# Patient Record
Sex: Male | Born: 1985 | Race: White | Hispanic: No | Marital: Single
Health system: Southern US, Community
[De-identification: ages and names within clinical notes are randomized; demographics above are authoritative.]

## PROBLEM LIST (undated history)

## (undated) DIAGNOSIS — L409 Psoriasis, unspecified: Secondary | ICD-10-CM

## (undated) DIAGNOSIS — J45909 Unspecified asthma, uncomplicated: Secondary | ICD-10-CM

---

## 2005-08-03 ENCOUNTER — Emergency Department (HOSPITAL_COMMUNITY): Admission: EM | Admit: 2005-08-03 | Discharge: 2005-08-03 | Payer: Self-pay | Admitting: Emergency Medicine

## 2007-06-20 IMAGING — US US SCROTUM
1 series · 14 of 25 positions shown · non-contrast
Comparison: None.

CLINICAL DATA: Left testicular pain and swelling.
SCROTAL ULTRASOUND:
DOPPLER ULTRASOUND OF THE TESTICLES:
TECHNIQUE: Complete ultrasound examination of the testicles, epididymis, and other scrotal structures was performed.  Color and spectral Doppler ultrasound were also utilized to evaluate blood flow to the testicles.

[Series 1: unknown · 0.09mm/px · 14 of 65 slices shown]
[im 1/65]
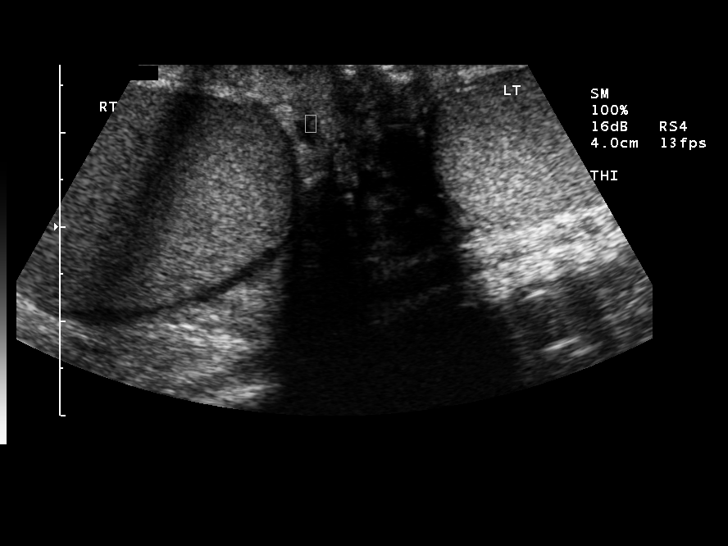
[im 6/65]
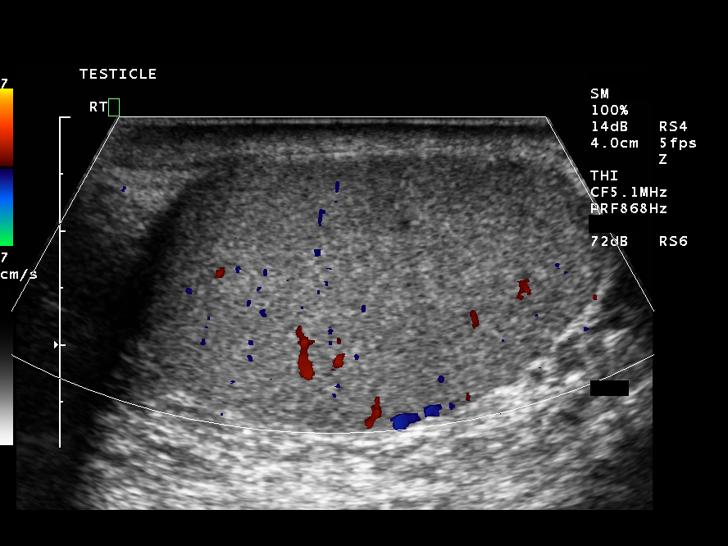
[im 11/65]
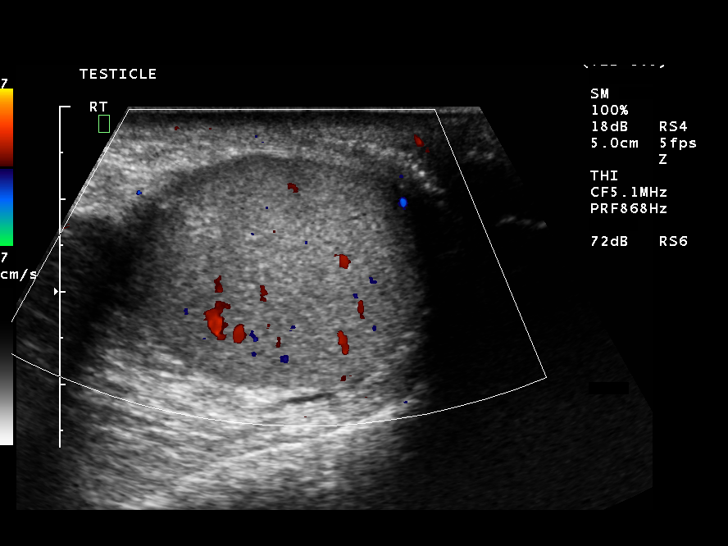
[im 17/65]
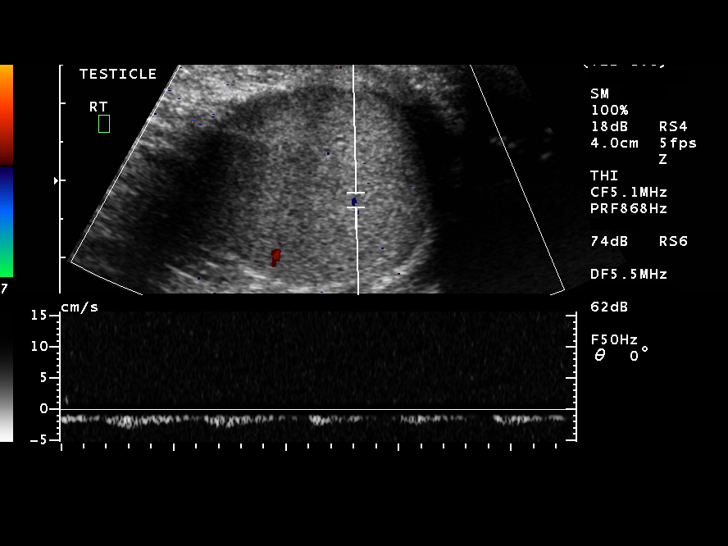
[im 22/65]
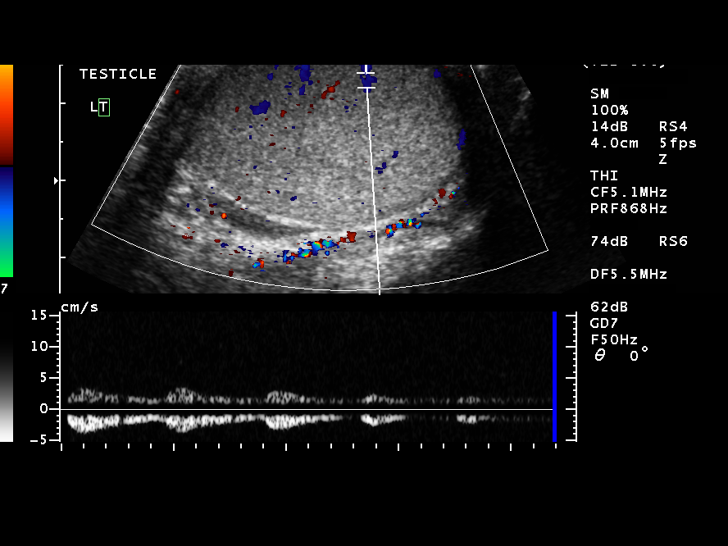
[im 25/65]
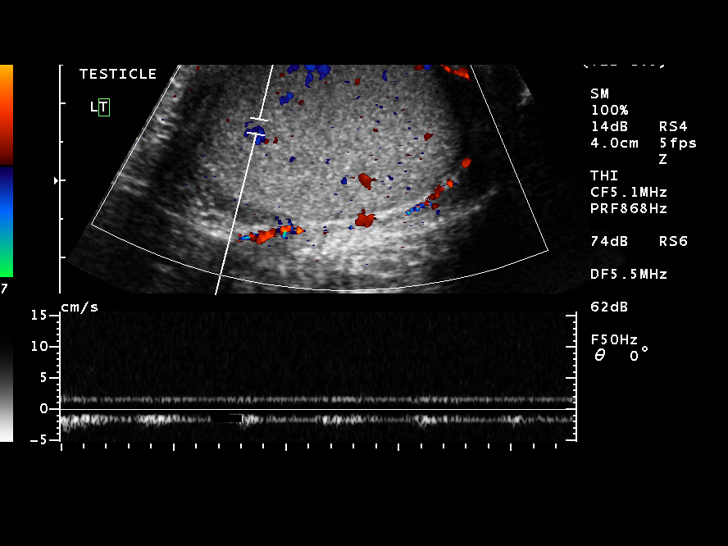
[im 30/65]
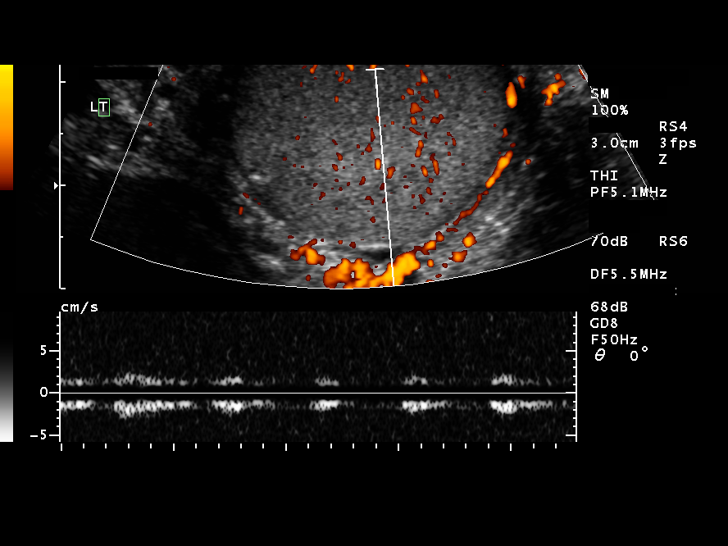
[im 35/65]
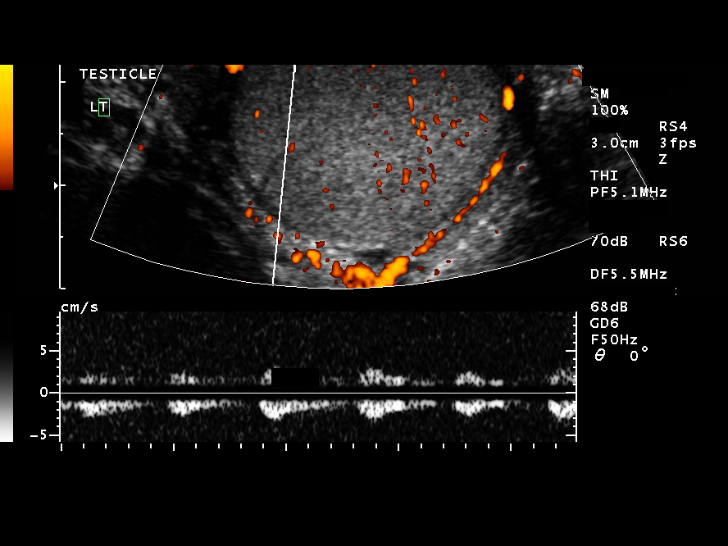
[im 41/65]
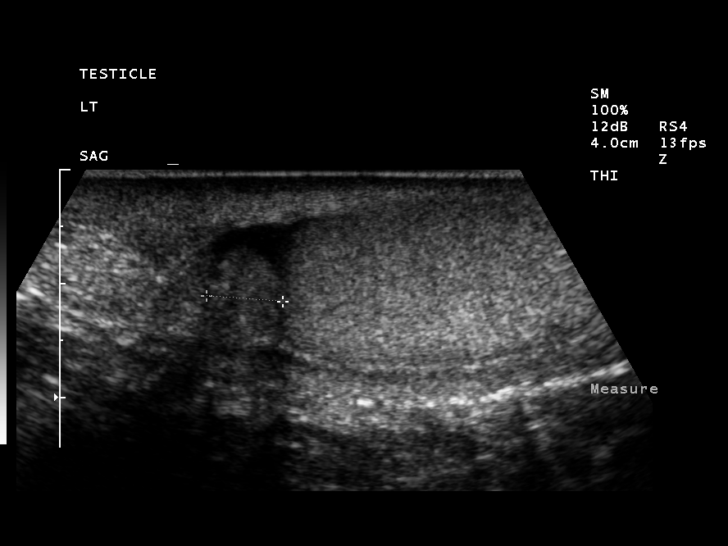
[im 43/65]
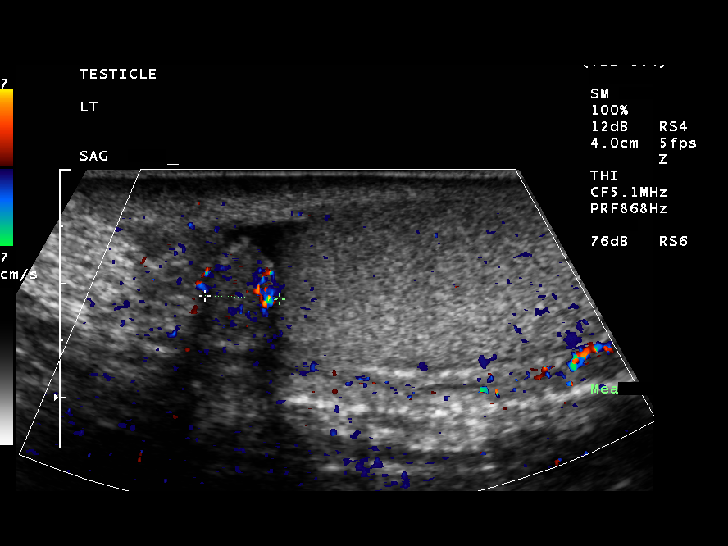
[im 49/65]
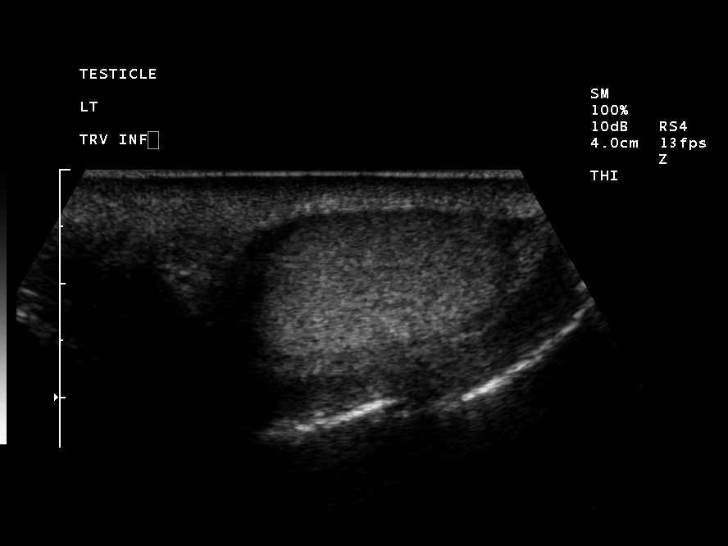
[im 54/65]
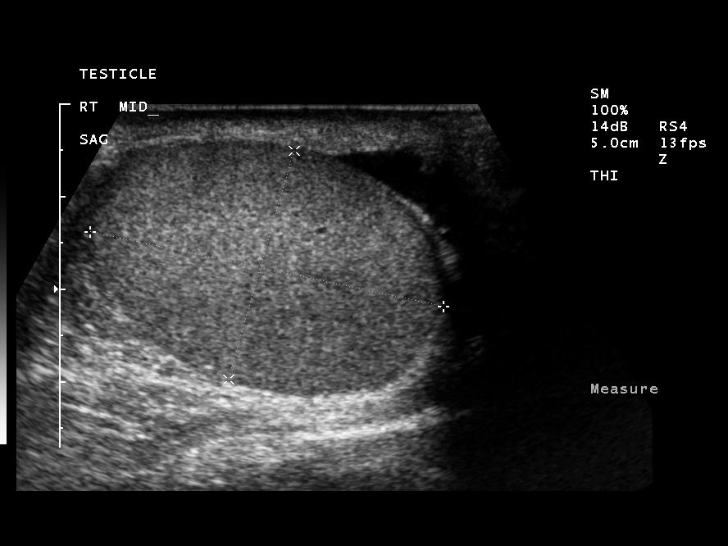
[im 59/65]
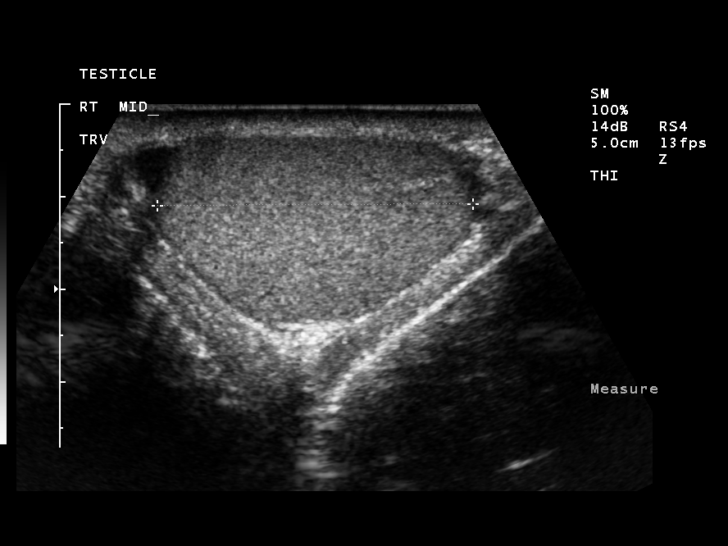
[im 65/65]
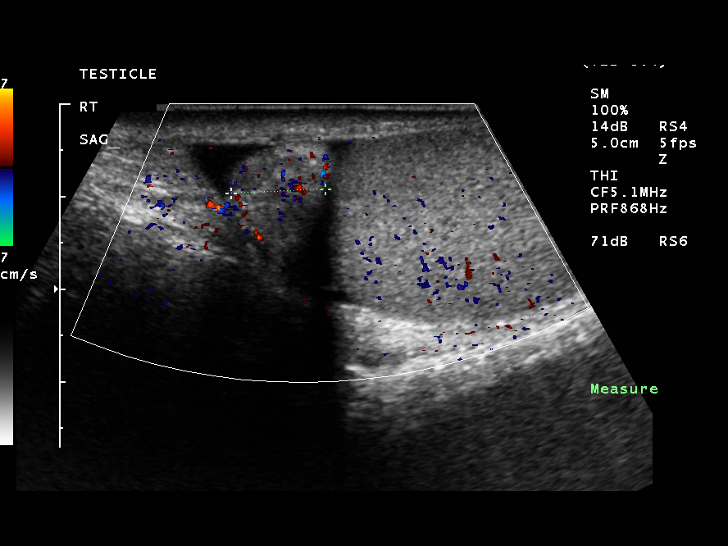

[14 of 25 positions shown; findings below may reference images not displayed]

FINDINGS: Right testis 3.9 x 2.6 x 3.4 cm.  Left testis 4.3 x 2.2 x 4.1 cm.  There are normal color and spectral tracings within bilateral testes.  There are small hydroceles bilaterally.  Both epididymides are normal in size and echo texture.  Apparent Doppler signal in the epididymides is felt to be due to the level of gain placed on the transducer rather than actual hyperemia.
IMPRESSION: 1.  Small bilateral hydroceles. 
2.  No evidence of testicular torsion or other explanation for left-sided testicular pain.

## 2014-04-28 ENCOUNTER — Ambulatory Visit: Payer: Self-pay | Admitting: Family Medicine

## 2016-03-14 IMAGING — CR DG FEMUR 2V*R*
4 series · 4 of 4 positions shown · non-contrast
Comparison: None.

CLINICAL DATA: Acute upper thigh burning, sudden onset and getting
worse.

EXAM:
RIGHT FEMUR - 2 VIEW

[femur ap (1 of 2)]
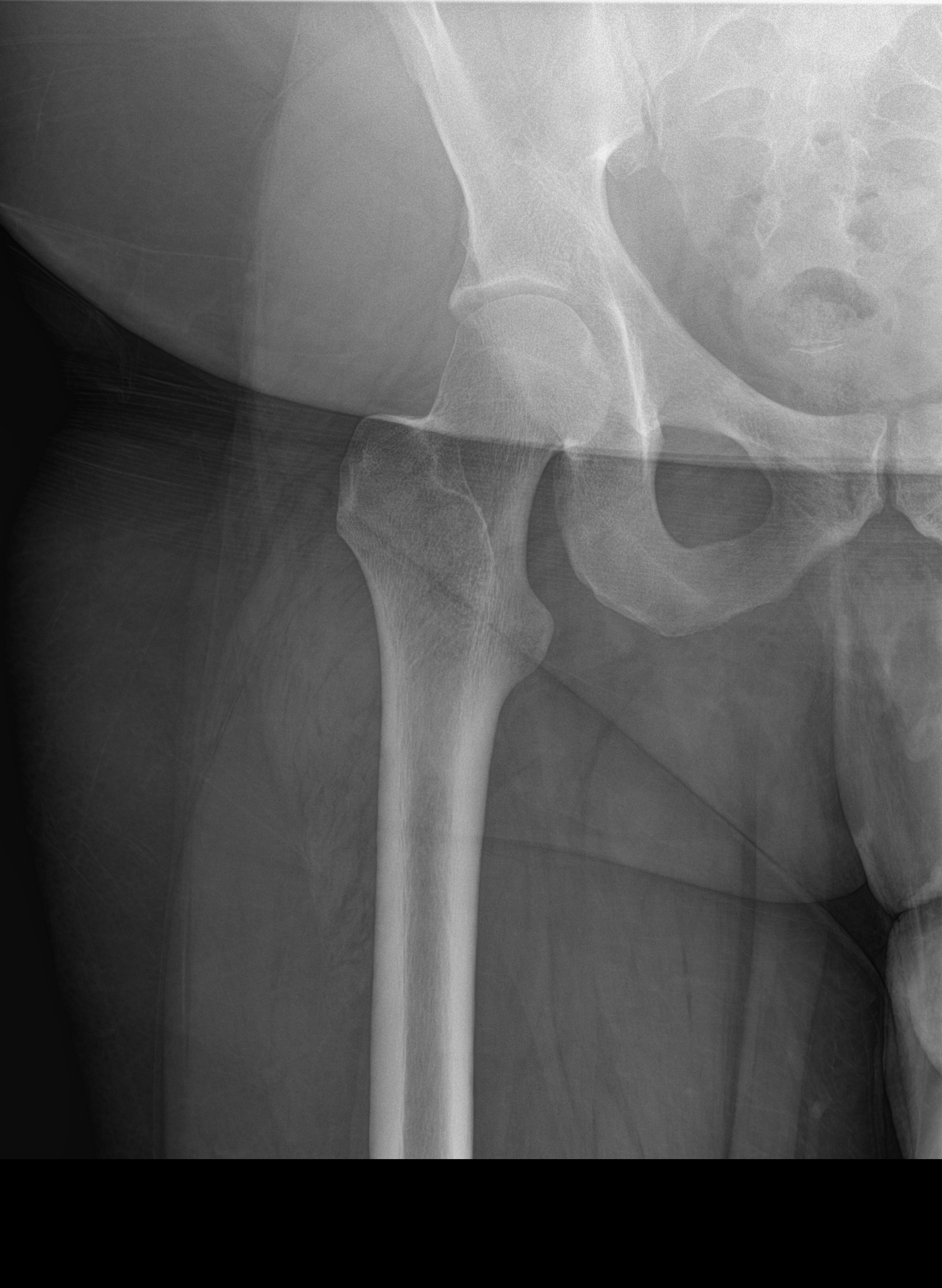

[femur ap (2 of 2)]
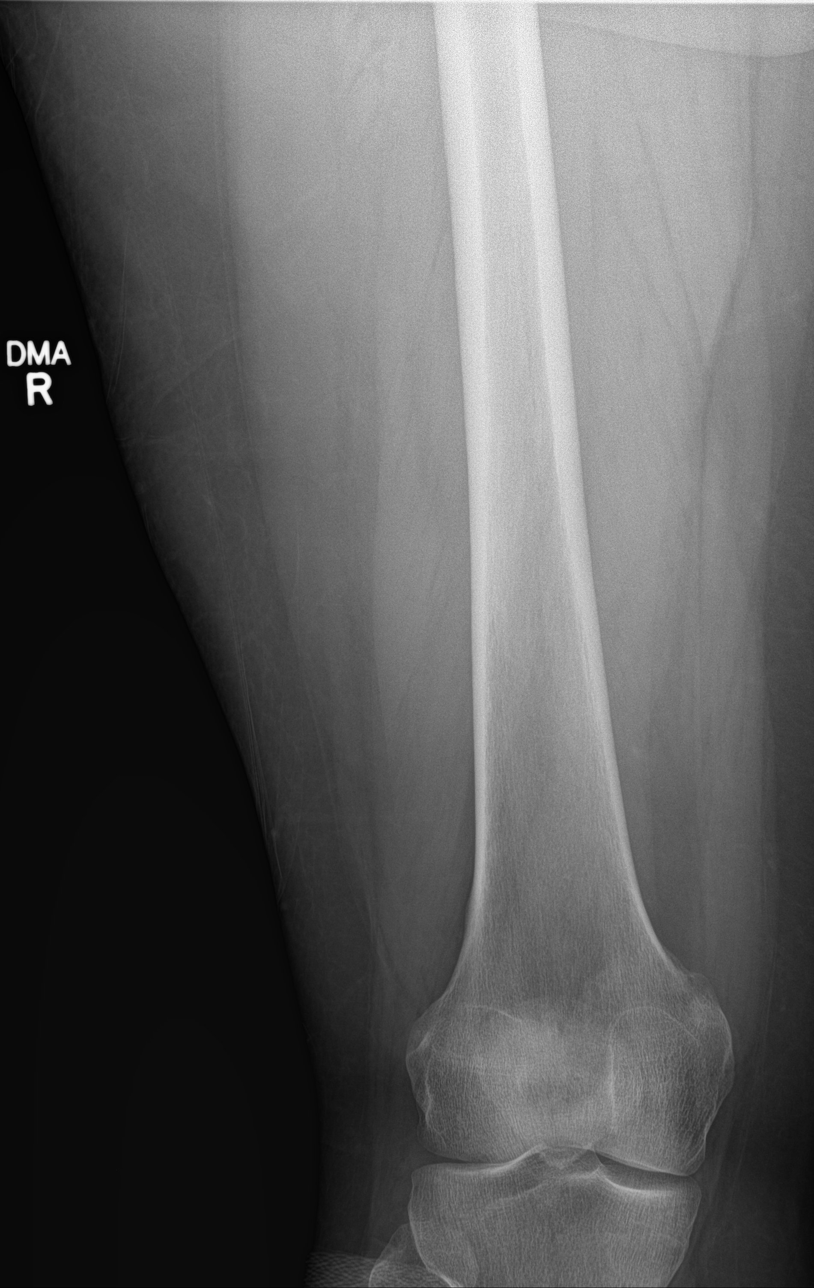

[femur lat (1 of 2)]
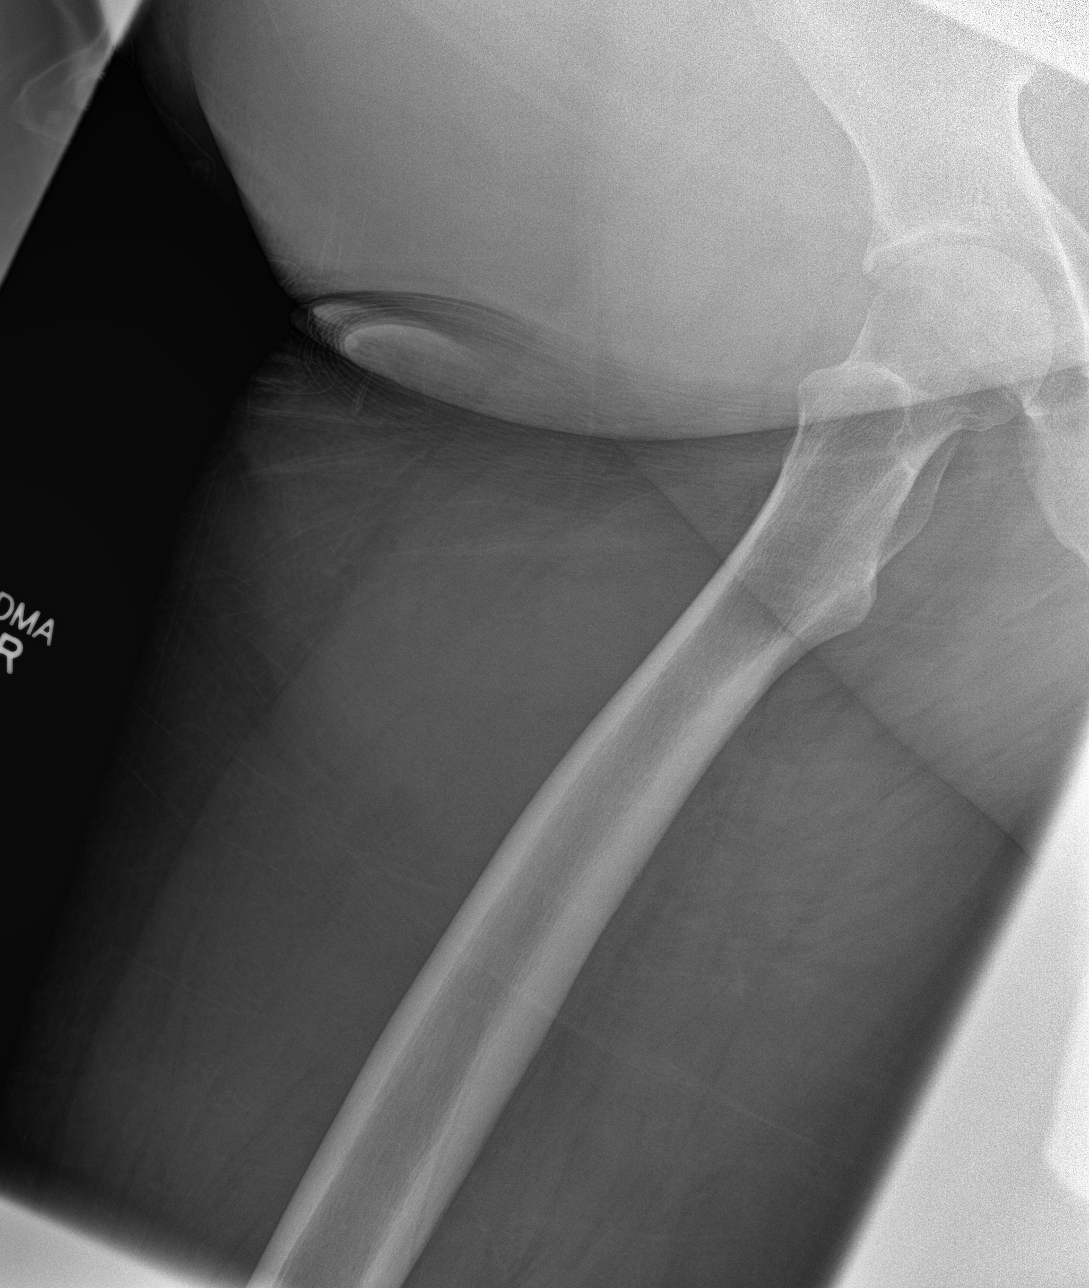

[femur lat (2 of 2)]
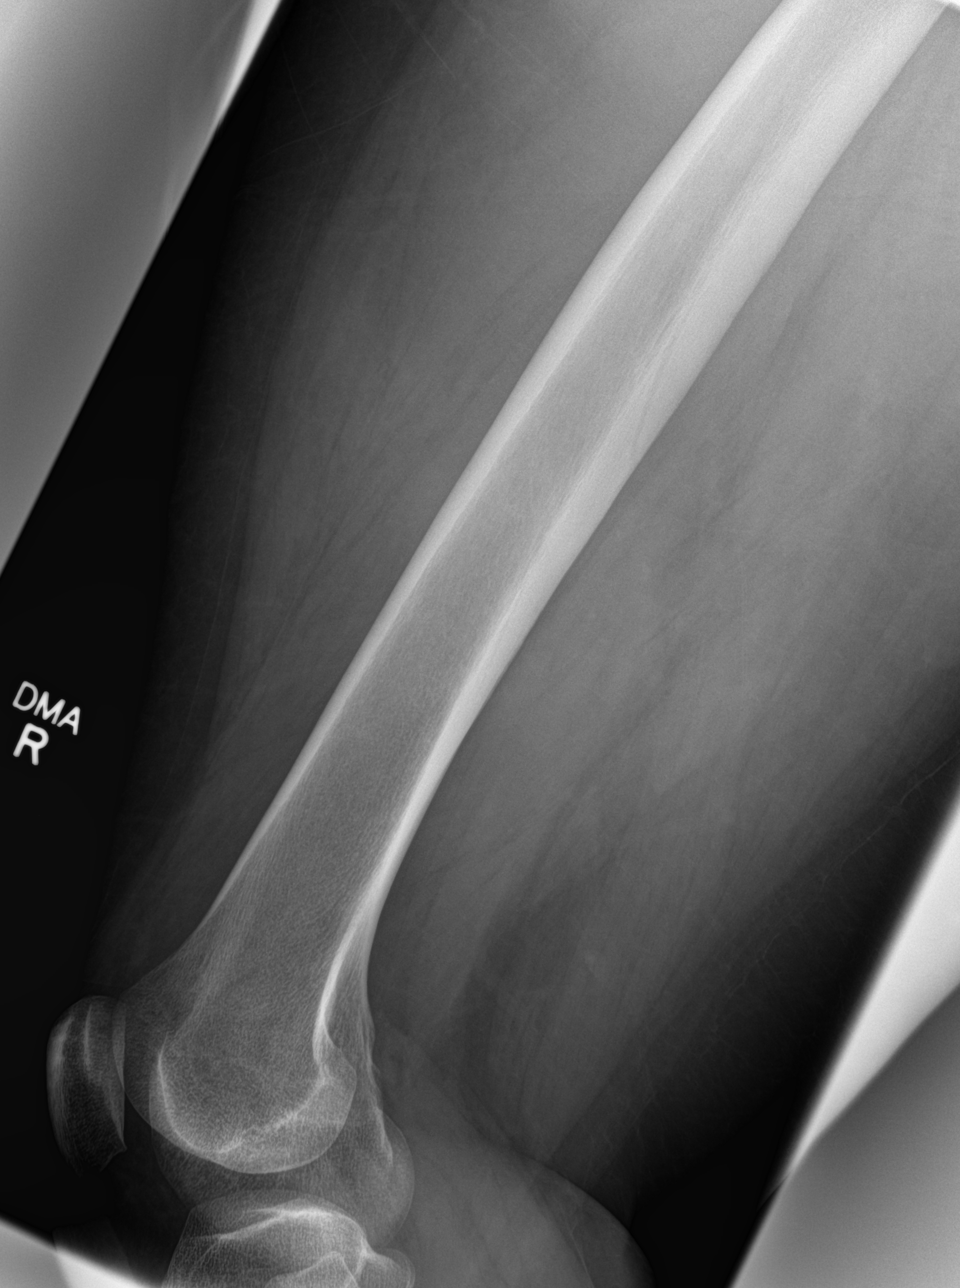

[4 of 4 positions shown; findings below may reference images not displayed]

FINDINGS: There is no evidence of fracture or other focal bone lesions. Soft
tissues are unremarkable.
IMPRESSION: Negative.

## 2019-06-05 ENCOUNTER — Ambulatory Visit: Payer: Self-pay | Attending: Internal Medicine

## 2019-06-05 DIAGNOSIS — Z23 Encounter for immunization: Secondary | ICD-10-CM

## 2019-06-05 NOTE — Progress Notes (Signed)
   Covid-19 Vaccination Clinic  Name:  James Novak    MRN: [id number] DOB: 01-06-1986  06/05/2019  James Novak was observed post Covid-19 immunization for 15 minutes without incident. He was provided with Vaccine Information Sheet and instruction to access the V-Safe system.   James Novak was instructed to call 911 with any severe reactions post vaccine: Marland Kitchen Difficulty breathing  . Swelling of face and throat  . A fast heartbeat  . A bad rash all over body  . Dizziness and weakness

## 2019-07-03 ENCOUNTER — Ambulatory Visit: Payer: Self-pay | Attending: Internal Medicine

## 2019-07-03 DIAGNOSIS — Z23 Encounter for immunization: Secondary | ICD-10-CM

## 2019-07-03 NOTE — Progress Notes (Signed)
   Covid-19 Vaccination Clinic  Name:  James Novak    MRN: [id number] DOB: [birth year]  07/03/2019  [name] was observed post Covid-19 immunization for 15 minutes without incident. He was provided with Vaccine Information Sheet and instruction to access the V-Safe system.   [name] was instructed to call 911 with any severe reactions post vaccine: Marland Kitchen Difficulty breathing  . Swelling of face and throat  . A fast heartbeat  . A bad rash all over body  . Dizziness and weakness   Immunizations Administered    Name Date Dose VIS Date Route   Moderna COVID-19 Vaccine 07/03/2019 12:07 PM 0.5 mL 02/2019 Intramuscular   Manufacturer: Moderna   Lot: [id number]   NDC: 65784-696-29

## 2023-07-04 ENCOUNTER — Ambulatory Visit
Admission: RE | Admit: 2023-07-04 | Discharge: 2023-07-04 | Disposition: A | Discharge status: Home / Self Care | Source: Ambulatory Visit | Attending: Family Medicine | Admitting: Family Medicine

## 2023-07-04 VITALS — BP 122/79 | HR 94 | Temp 98.7°F | Resp 18

## 2023-07-04 DIAGNOSIS — L02212 Cutaneous abscess of back [any part, except buttock]: Secondary | ICD-10-CM | POA: Diagnosis not present

## 2023-07-04 HISTORY — DX: Unspecified asthma, uncomplicated: J45.909

## 2023-07-04 HISTORY — DX: Psoriasis, unspecified: L40.9

## 2023-07-04 MED ORDER — CEPHALEXIN 250 MG/5ML PO SUSR: 500.0000 mg | Freq: Three times a day (TID) | ORAL | 0 refills | Status: AC

## 2023-07-04 MED ORDER — SULFAMETHOXAZOLE-TRIMETHOPRIM 200-40 MG/5ML PO SUSP: 160.0000 mg | Freq: Two times a day (BID) | ORAL | 0 refills | Status: AC

## 2023-07-04 NOTE — Discharge Instructions (Addendum)
 Start Keflex  and Bactrim  as prescribed.  Warm compresses to the area as needed.  Please follow-up with your PCP in 2 to 3 days for recheck.  Please go to the ER if you develop any worsening symptoms including fever.  Hope you feel better soon!

## 2023-07-04 NOTE — ED Triage Notes (Signed)
 Pt present with a ?insect bite to his back x 2 days. The area is red and painful. Pt also states that he ran a fever.

## 2023-07-04 NOTE — ED Provider Notes (Signed)
 [facility]    CSN: [id number] Arrival date & time: 07/04/23  1542      History   Chief Complaint Chief Complaint  Patient presents with   Insect Bite    Entered by patient    HPI James Novak is a 38 y.o. male presents for an abscess.  Patient reports 2 days of a worsening painful abscess on his left mid back.  Denies any drainage.  Does endorse some subjective fevers last night.  Denies history of MRSA.  Does report history of abscesses in the past.  He has not used any OTC treatments for symptoms.  No other concerns at this time.  HPI  Past Medical History:  Diagnosis Date   Asthma    Psoriasis     There are no active problems to display for this patient.   History reviewed. No pertinent surgical history.     Home Medications    Prior to Admission medications   Medication Sig Start Date End Date Taking? Authorizing Provider  AIRSUPRA 90-80 MCG/ACT AERO SMARTSIG:1 inhalation Via Inhaler 4 Times Daily 01/29/23  Yes [provider]  cephALEXin  (KEFLEX ) 250 MG/5ML suspension Take 10 mLs (500 mg total) by mouth 3 (three) times daily for 7 days. 07/04/23 07/11/23 Yes Gaby Harney, Jodi R, NP  Cholecalciferol (VITAMIN D3) 1.25 MG (50000 UT) CAPS Take 1 capsule by mouth once a week. 04/04/23  Yes [provider]  simvastatin (ZOCOR) 20 MG tablet Take 20 mg by mouth daily. 04/27/23  Yes [provider]  sulfamethoxazole -trimethoprim  (BACTRIM ) 200-40 MG/5ML suspension Take 20 mLs by mouth 2 (two) times daily for 7 days. 07/04/23 07/11/23 Yes Eloyse Causey, Jodi R, NP  TREMFYA 100 MG/ML pen  07/01/23  Yes [provider]    Family History No family history on file.  Social History Social History   Tobacco Use   Smoking status: Former    Types: Cigarettes   Smokeless tobacco: Never  Vaping Use   Vaping status: Never Used  Substance Use Topics   Alcohol use: Yes   Drug use: Never     Allergies   Patient has no known  allergies.   Review of Systems Review of Systems  Skin:        Abscess of back     Physical Exam Triage Vital Signs ED Triage Vitals  Encounter Vitals Group     BP 07/04/23 1555 122/79     Systolic BP Percentile --      Diastolic BP Percentile --      Pulse Rate 07/04/23 1555 94     Resp 07/04/23 1555 18     Temp 07/04/23 1555 98.7 F (37.1 C)     Temp Source 07/04/23 1555 Oral     SpO2 07/04/23 1555 98 %     Weight --      Height --      Head Circumference --      Peak Flow --      Pain Score 07/04/23 1552 5     Pain Loc --      Pain Education --      Exclude from Growth Chart --    No data found.  Updated Vital Signs BP 122/79 (BP Location: Left Arm)   Pulse 94   Temp 98.7 F (37.1 C) (Oral)   Resp 18   SpO2 98%   Visual Acuity Right Eye Distance:   Left Eye Distance:   Bilateral Distance:    Right Eye  Near:   Left Eye Near:    Bilateral Near:     Physical Exam Vitals and nursing note reviewed.  Constitutional:      General: He is not in acute distress.    Appearance: Normal appearance. He is not ill-appearing.  HENT:     Head: Normocephalic and atraumatic.  Eyes:     Pupils: Pupils are equal, round, and reactive to light.  Cardiovascular:     Rate and Rhythm: Normal rate.  Pulmonary:     Effort: Pulmonary effort is normal.  Skin:    General: Skin is warm and dry.          Comments: There is a 4x5cm indurated non fluctuant abscess the the mid left back, no drainage.  Area is tender to palpation.  Neurological:     General: No focal deficit present.     Mental Status: He is alert and oriented to person, place, and time.  Psychiatric:        Mood and Affect: Mood normal.        Behavior: Behavior normal.      UC Treatments / Results  Labs (all labs ordered are listed, but only abnormal results are displayed) Labs Reviewed - No data to display  EKG   Radiology No results found.  Procedures Procedures (including critical care  time)  Medications Ordered in UC Medications - No data to display  Initial Impression / Assessment and Plan / UC Course  I have reviewed the triage vital signs and the nursing notes.  Pertinent labs & imaging results that were available during my care of the patient were reviewed by me and considered in my medical decision making (see chart for details).     Reviewed exam and symptoms with patient.  No red flags.  Will start Bactrim  and Keflex  as prescribed.  Patient states he cannot take pills and needs liquid.  Advised warm compresses and OTC analgesics as needed.  PCP follow-up 2 to 3 days for recheck.  ER precautions reviewed and patient verbalized understanding. Final Clinical Impressions(s) / UC Diagnoses   Final diagnoses:  Cutaneous abscess of back excluding buttocks     Discharge Instructions      Start Keflex  and Bactrim  as prescribed.  Warm compresses to the area as needed.  Please follow-up with your PCP in 2 to 3 days for recheck.  Please go to the ER if you develop any worsening symptoms including fever.  Hope you feel better soon!    ED Prescriptions     Medication Sig Dispense Auth. Provider   sulfamethoxazole -trimethoprim  (BACTRIM ) 200-40 MG/5ML suspension Take 20 mLs by mouth 2 (two) times daily for 7 days. 280 mL Yolani Vo, Jodi R, NP   cephALEXin  (KEFLEX ) 250 MG/5ML suspension Take 10 mLs (500 mg total) by mouth 3 (three) times daily for 7 days. 210 mL Lareen Mullings, Jodi R, NP      PDMP not reviewed this encounter.   Alleen Arbour, NP 07/04/23 [phone]
